# Patient Record
Sex: Female | Born: 1955 | Race: Black or African American | Hispanic: No | Marital: Married
Health system: Southern US, Community
[De-identification: ages and names within clinical notes are randomized; demographics above are authoritative.]

---

## 1998-04-22 ENCOUNTER — Encounter: Admission: RE | Admit: 1998-04-22 | Discharge: 1998-04-22 | Payer: Self-pay | Admitting: *Deleted

## 2000-08-31 ENCOUNTER — Encounter: Payer: Self-pay | Admitting: Otolaryngology

## 2000-08-31 ENCOUNTER — Encounter: Admission: RE | Admit: 2000-08-31 | Discharge: 2000-08-31 | Payer: Self-pay | Admitting: Otolaryngology

## 2001-11-12 ENCOUNTER — Emergency Department (HOSPITAL_COMMUNITY): Admission: EM | Admit: 2001-11-12 | Discharge: 2001-11-12 | Payer: Self-pay | Admitting: Emergency Medicine

## 2007-02-12 ENCOUNTER — Encounter
Admission: RE | Admit: 2007-02-12 | Discharge: 2007-02-12 | Payer: Self-pay | Admitting: Physical Medicine and Rehabilitation

## 2013-05-07 ENCOUNTER — Other Ambulatory Visit: Payer: Self-pay | Admitting: Nurse Practitioner

## 2013-05-07 DIAGNOSIS — Z1231 Encounter for screening mammogram for malignant neoplasm of breast: Secondary | ICD-10-CM

## 2013-05-31 ENCOUNTER — Ambulatory Visit: Payer: Self-pay

## 2013-06-14 ENCOUNTER — Ambulatory Visit: Payer: Self-pay

## 2013-07-01 ENCOUNTER — Ambulatory Visit
Admission: RE | Admit: 2013-07-01 | Discharge: 2013-07-01 | Disposition: A | Discharge status: Home / Self Care | Payer: PRIVATE HEALTH INSURANCE | Source: Ambulatory Visit | Attending: Nurse Practitioner | Admitting: Nurse Practitioner

## 2013-07-01 DIAGNOSIS — Z1231 Encounter for screening mammogram for malignant neoplasm of breast: Secondary | ICD-10-CM

## 2013-07-03 ENCOUNTER — Other Ambulatory Visit: Payer: Self-pay | Admitting: Nurse Practitioner

## 2013-07-03 DIAGNOSIS — R928 Other abnormal and inconclusive findings on diagnostic imaging of breast: Secondary | ICD-10-CM

## 2013-07-26 ENCOUNTER — Other Ambulatory Visit: Payer: PRIVATE HEALTH INSURANCE

## 2013-08-09 ENCOUNTER — Other Ambulatory Visit: Payer: PRIVATE HEALTH INSURANCE

## 2013-08-21 ENCOUNTER — Ambulatory Visit
Admission: RE | Admit: 2013-08-21 | Discharge: 2013-08-21 | Disposition: A | Discharge status: Home / Self Care | Payer: PRIVATE HEALTH INSURANCE | Source: Ambulatory Visit | Attending: Nurse Practitioner | Admitting: Nurse Practitioner

## 2013-08-21 DIAGNOSIS — R928 Other abnormal and inconclusive findings on diagnostic imaging of breast: Secondary | ICD-10-CM

## 2014-01-24 ENCOUNTER — Other Ambulatory Visit: Payer: Self-pay | Admitting: Nurse Practitioner

## 2014-01-24 DIAGNOSIS — R921 Mammographic calcification found on diagnostic imaging of breast: Secondary | ICD-10-CM

## 2014-02-26 ENCOUNTER — Ambulatory Visit
Admission: RE | Admit: 2014-02-26 | Discharge: 2014-02-26 | Disposition: A | Discharge status: Home / Self Care | Payer: Self-pay | Source: Ambulatory Visit | Attending: Nurse Practitioner | Admitting: Nurse Practitioner

## 2014-02-26 DIAGNOSIS — R921 Mammographic calcification found on diagnostic imaging of breast: Secondary | ICD-10-CM

## 2014-07-17 ENCOUNTER — Other Ambulatory Visit: Payer: Self-pay | Admitting: Nurse Practitioner

## 2014-07-17 DIAGNOSIS — R921 Mammographic calcification found on diagnostic imaging of breast: Secondary | ICD-10-CM

## 2014-07-28 ENCOUNTER — Inpatient Hospital Stay: Admission: RE | Admit: 2014-07-28 | Payer: PRIVATE HEALTH INSURANCE | Source: Ambulatory Visit

## 2014-09-01 ENCOUNTER — Ambulatory Visit
Admission: RE | Admit: 2014-09-01 | Discharge: 2014-09-01 | Disposition: A | Discharge status: Home / Self Care | Payer: 59 | Source: Ambulatory Visit | Attending: Nurse Practitioner | Admitting: Nurse Practitioner

## 2014-09-01 DIAGNOSIS — R921 Mammographic calcification found on diagnostic imaging of breast: Secondary | ICD-10-CM

## 2015-08-31 ENCOUNTER — Other Ambulatory Visit: Payer: Self-pay

## 2015-08-31 DIAGNOSIS — Z1231 Encounter for screening mammogram for malignant neoplasm of breast: Secondary | ICD-10-CM

## 2015-09-16 ENCOUNTER — Ambulatory Visit: Payer: Self-pay

## 2015-10-09 ENCOUNTER — Ambulatory Visit: Payer: Self-pay

## 2015-10-29 ENCOUNTER — Ambulatory Visit
Admission: RE | Admit: 2015-10-29 | Discharge: 2015-10-29 | Disposition: A | Discharge status: Home / Self Care | Payer: 59 | Source: Ambulatory Visit

## 2015-10-29 DIAGNOSIS — Z1231 Encounter for screening mammogram for malignant neoplasm of breast: Secondary | ICD-10-CM

## 2016-05-09 ENCOUNTER — Ambulatory Visit: Payer: Self-pay | Admitting: Gynecology

## 2016-06-13 ENCOUNTER — Ambulatory Visit: Payer: Self-pay | Admitting: Gynecology

## 2016-06-27 ENCOUNTER — Ambulatory Visit: Payer: Self-pay | Admitting: Gynecology

## 2016-07-25 ENCOUNTER — Ambulatory Visit: Payer: Self-pay | Admitting: Gynecology

## 2016-07-25 DIAGNOSIS — Z0289 Encounter for other administrative examinations: Secondary | ICD-10-CM

## 2016-10-07 ENCOUNTER — Other Ambulatory Visit: Payer: Self-pay | Admitting: Family Medicine

## 2016-10-07 DIAGNOSIS — Z1231 Encounter for screening mammogram for malignant neoplasm of breast: Secondary | ICD-10-CM

## 2016-11-04 ENCOUNTER — Ambulatory Visit: Payer: Self-pay

## 2016-11-28 ENCOUNTER — Ambulatory Visit: Payer: Self-pay

## 2017-01-13 ENCOUNTER — Ambulatory Visit: Payer: Self-pay

## 2017-02-20 ENCOUNTER — Ambulatory Visit: Payer: Self-pay

## 2017-03-03 ENCOUNTER — Ambulatory Visit: Payer: Self-pay

## 2017-03-21 ENCOUNTER — Ambulatory Visit
Admission: RE | Admit: 2017-03-21 | Discharge: 2017-03-21 | Disposition: A | Discharge status: Home / Self Care | Payer: BLUE CROSS/BLUE SHIELD | Source: Ambulatory Visit | Attending: Family Medicine | Admitting: Family Medicine

## 2017-03-21 DIAGNOSIS — Z1231 Encounter for screening mammogram for malignant neoplasm of breast: Secondary | ICD-10-CM

## 2017-03-22 ENCOUNTER — Encounter: Payer: Self-pay | Admitting: Gynecology

## 2018-04-25 ENCOUNTER — Other Ambulatory Visit: Payer: Self-pay | Admitting: Family Medicine

## 2018-04-25 DIAGNOSIS — Z1231 Encounter for screening mammogram for malignant neoplasm of breast: Secondary | ICD-10-CM

## 2018-06-01 ENCOUNTER — Ambulatory Visit: Payer: BLUE CROSS/BLUE SHIELD

## 2018-07-04 ENCOUNTER — Ambulatory Visit
Admission: RE | Admit: 2018-07-04 | Discharge: 2018-07-04 | Disposition: A | Discharge status: Home / Self Care | Payer: BLUE CROSS/BLUE SHIELD | Source: Ambulatory Visit | Attending: Family Medicine | Admitting: Family Medicine

## 2018-07-04 DIAGNOSIS — Z1231 Encounter for screening mammogram for malignant neoplasm of breast: Secondary | ICD-10-CM

## 2019-07-16 ENCOUNTER — Other Ambulatory Visit: Payer: Self-pay | Admitting: Family Medicine

## 2019-07-16 DIAGNOSIS — Z1231 Encounter for screening mammogram for malignant neoplasm of breast: Secondary | ICD-10-CM

## 2019-08-28 ENCOUNTER — Ambulatory Visit: Payer: BLUE CROSS/BLUE SHIELD

## 2019-10-23 ENCOUNTER — Ambulatory Visit
Admission: RE | Admit: 2019-10-23 | Discharge: 2019-10-23 | Disposition: A | Discharge status: Home / Self Care | Payer: BC Managed Care – PPO | Source: Ambulatory Visit | Attending: Family Medicine | Admitting: Family Medicine

## 2019-10-23 ENCOUNTER — Other Ambulatory Visit: Payer: Self-pay

## 2019-10-23 DIAGNOSIS — Z1231 Encounter for screening mammogram for malignant neoplasm of breast: Secondary | ICD-10-CM

## 2020-08-21 ENCOUNTER — Other Ambulatory Visit: Payer: Self-pay | Admitting: Family Medicine

## 2020-08-21 DIAGNOSIS — Z1231 Encounter for screening mammogram for malignant neoplasm of breast: Secondary | ICD-10-CM

## 2020-11-02 ENCOUNTER — Other Ambulatory Visit: Payer: Self-pay

## 2020-11-02 ENCOUNTER — Ambulatory Visit
Admission: RE | Admit: 2020-11-02 | Discharge: 2020-11-02 | Disposition: A | Discharge status: Home / Self Care | Payer: 59 | Source: Ambulatory Visit | Attending: Family Medicine | Admitting: Family Medicine

## 2020-11-02 DIAGNOSIS — Z1231 Encounter for screening mammogram for malignant neoplasm of breast: Secondary | ICD-10-CM

## 2020-12-14 ENCOUNTER — Ambulatory Visit: Payer: 59

## 2021-02-01 ENCOUNTER — Ambulatory Visit: Payer: 59

## 2021-02-09 ENCOUNTER — Other Ambulatory Visit: Payer: Self-pay

## 2021-02-09 ENCOUNTER — Ambulatory Visit
Admission: RE | Admit: 2021-02-09 | Discharge: 2021-02-09 | Disposition: A | Discharge status: Home / Self Care | Payer: 59 | Source: Ambulatory Visit | Attending: Family Medicine | Admitting: Family Medicine

## 2022-06-30 LAB — EXTERNAL GENERIC LAB PROCEDURE

## 2022-08-22 ENCOUNTER — Other Ambulatory Visit: Payer: Self-pay | Admitting: Family Medicine

## 2022-08-22 DIAGNOSIS — Z1231 Encounter for screening mammogram for malignant neoplasm of breast: Secondary | ICD-10-CM

## 2022-10-06 ENCOUNTER — Ambulatory Visit: Payer: Self-pay

## 2022-10-28 LAB — EXTERNAL GENERIC LAB PROCEDURE: COLOGUARD: NEGATIVE

## 2023-03-10 IMAGING — MG MM DIGITAL SCREENING BILAT W/ TOMO AND CAD
8 series · 9 of 24 positions shown · non-contrast
Comparison: Previous exam(s).

CLINICAL DATA: Screening.

EXAM:
DIGITAL SCREENING BILATERAL MAMMOGRAM WITH TOMOSYNTHESIS AND CAD
TECHNIQUE: Bilateral screening digital craniocaudal and mediolateral oblique
mammograms were obtained. Bilateral screening digital breast
tomosynthesis was performed. The images were evaluated with
computer-aided detection.

[R MLO synth-2D]
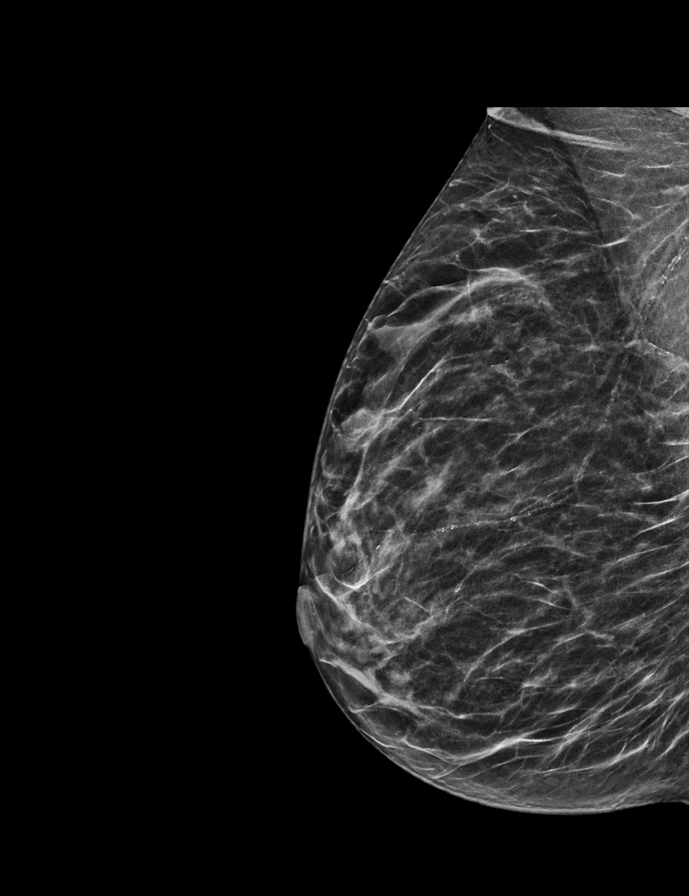

[R CC synth-2D]
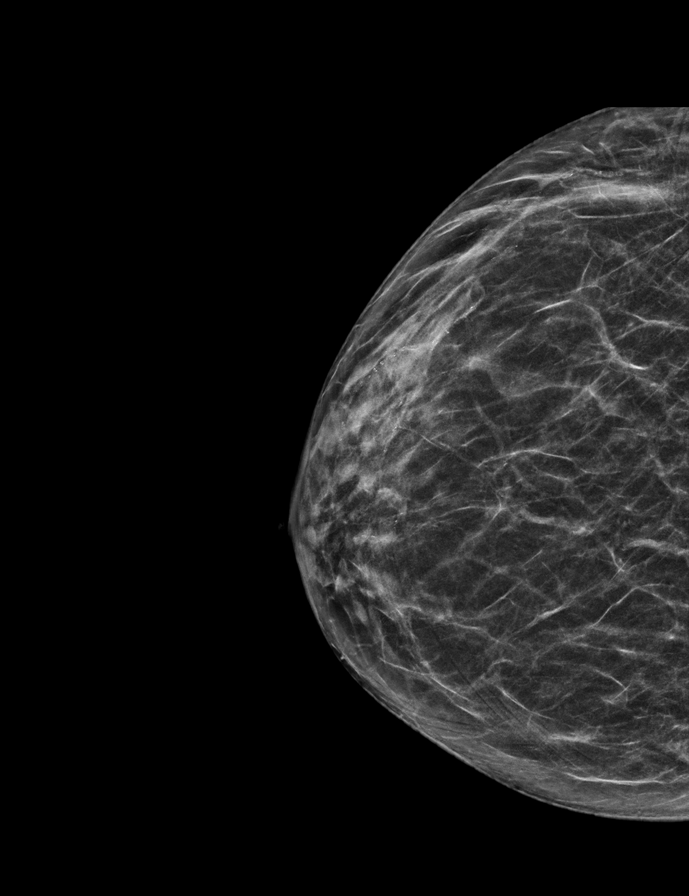

[L CC synth-2D]
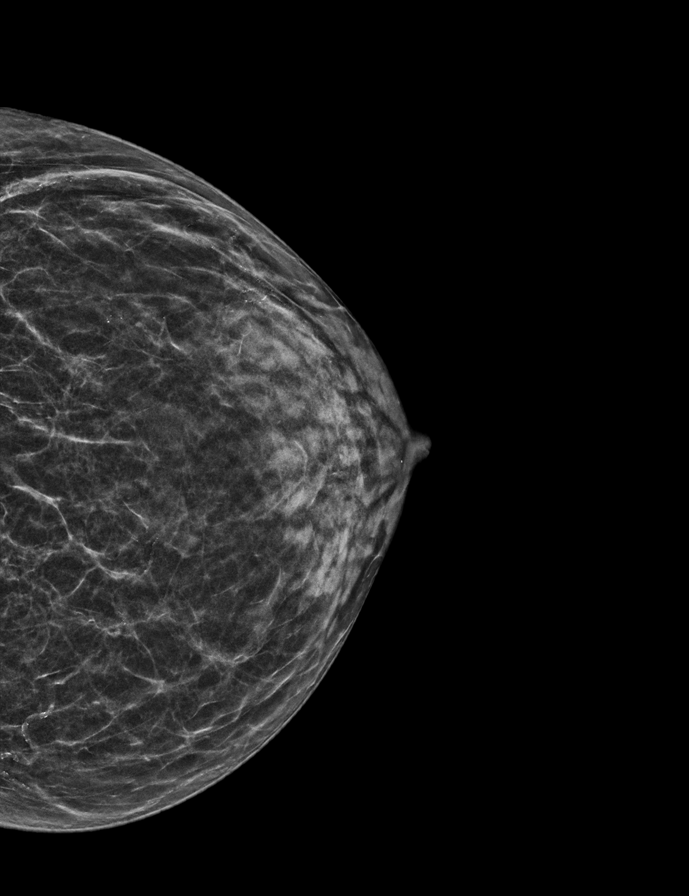

[L MLO synth-2D]
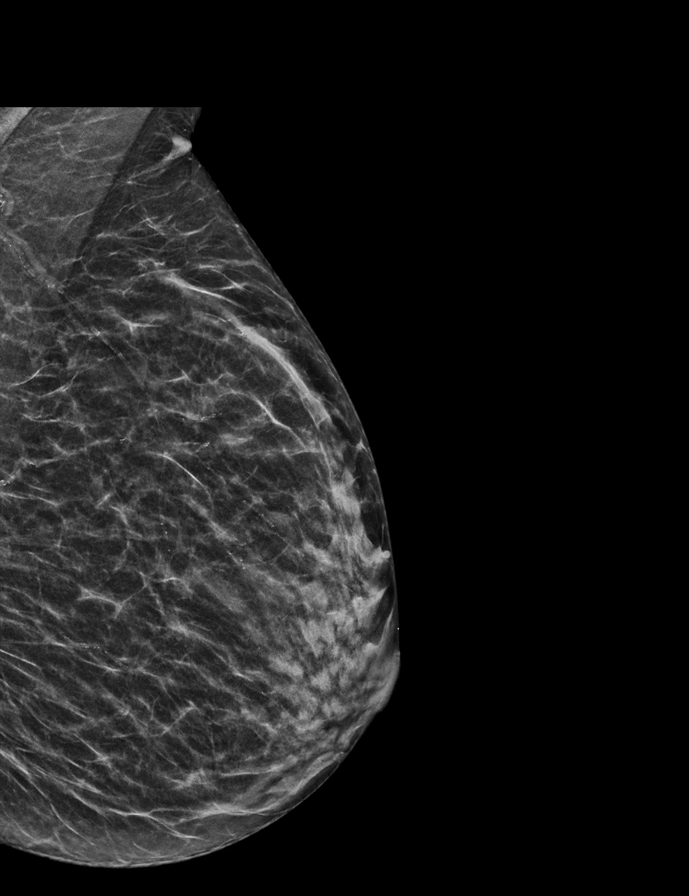

[L MLO tomo · 2 of 43 frames shown]
[frame 14/43]
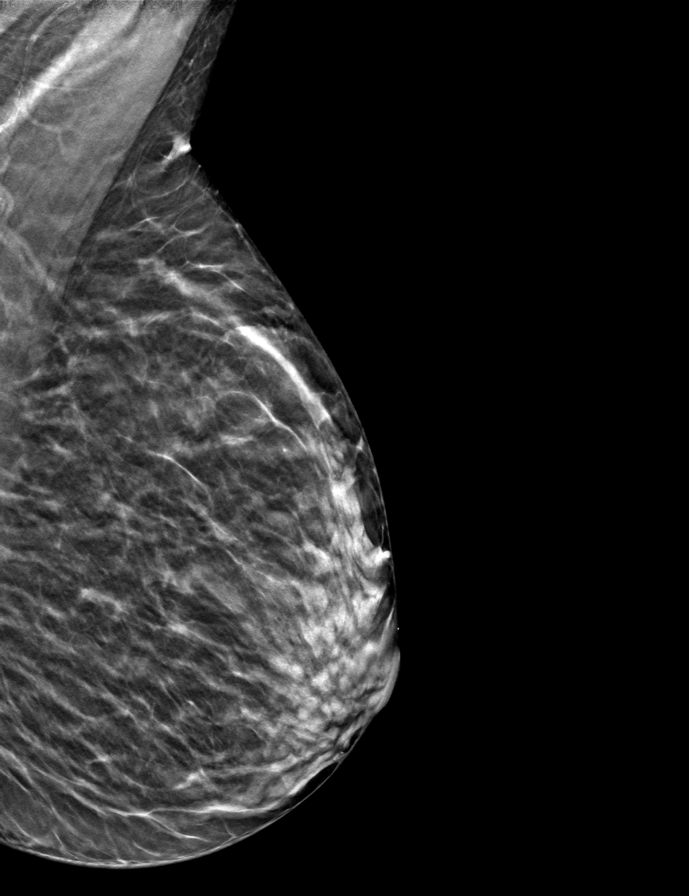
[frame 22/43]
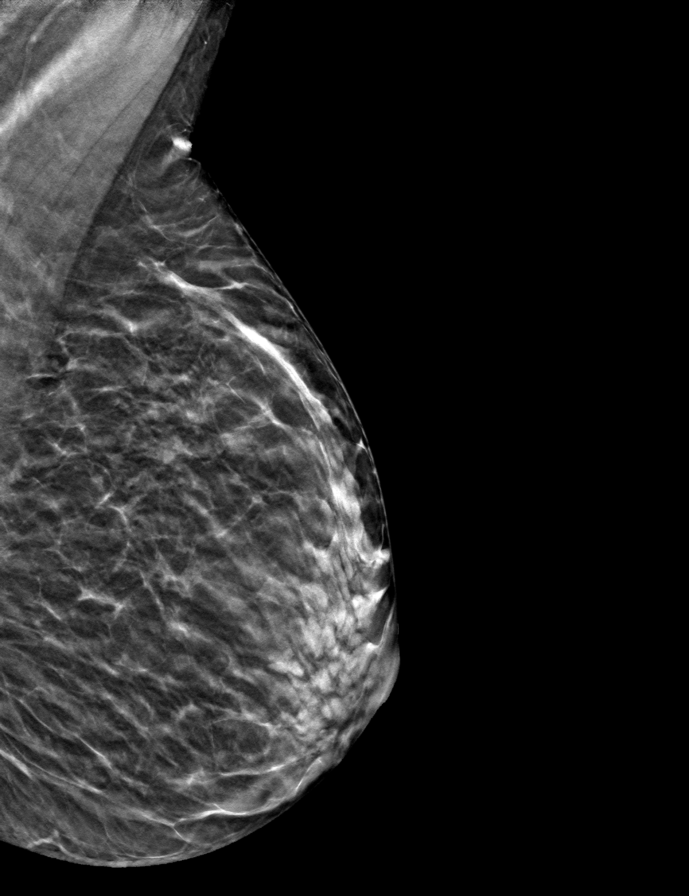

[R MLO tomo · tomo slice 25/49.0]
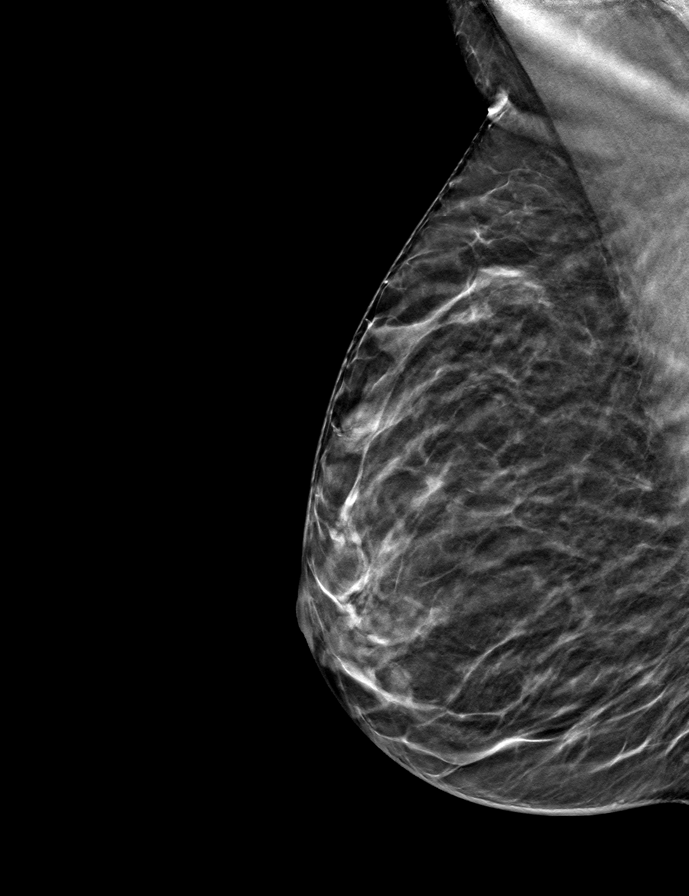

[L CC tomo · tomo slice 22/43.0]
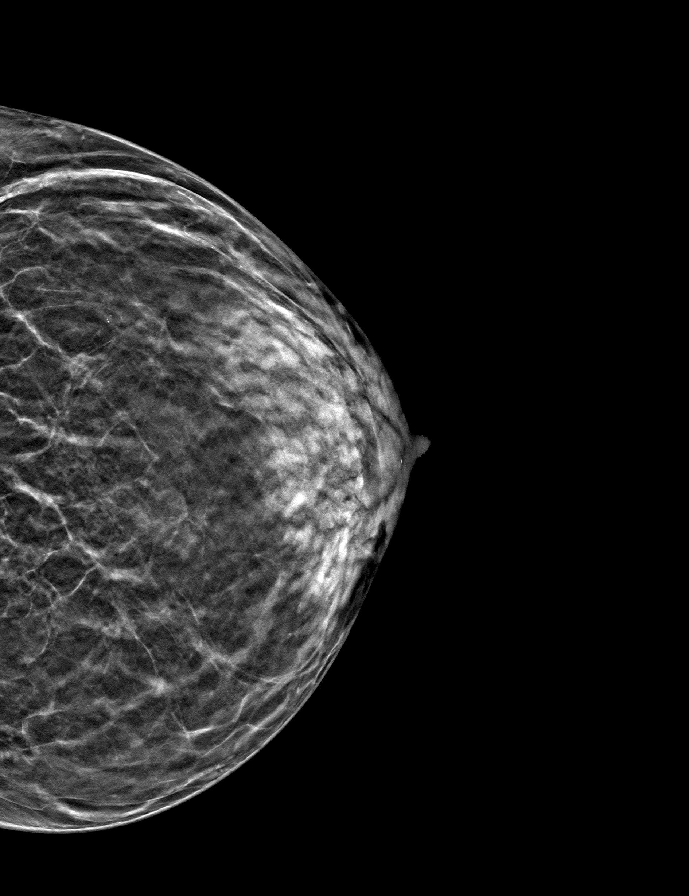

[R CC tomo · tomo slice 26/51.0]
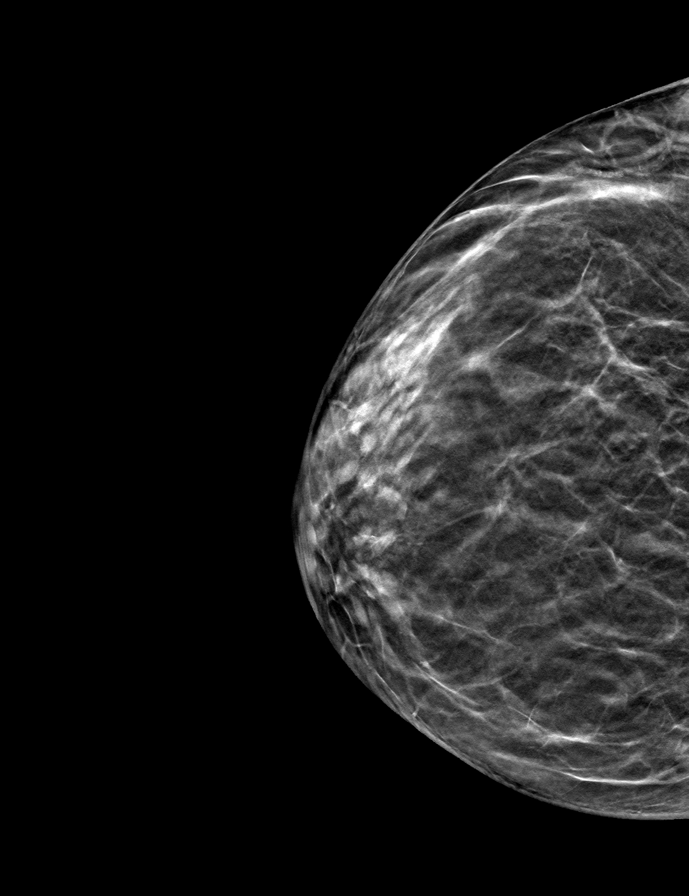

[9 of 24 positions shown; findings below may reference images not displayed]

ACR Breast Density Category c: The breast tissue is heterogeneously
dense, which may obscure small masses.
FINDINGS: There are no findings suspicious for malignancy. The images were
evaluated with computer-aided detection.
IMPRESSION: No mammographic evidence of malignancy. A result letter of this
screening mammogram will be mailed directly to the patient.

RECOMMENDATION:
Screening mammogram in one year. (Code:T4-5-GWO)

BI-RADS CATEGORY  1: Negative.

## 2023-08-16 ENCOUNTER — Ambulatory Visit: Payer: Medicare HMO | Admitting: Podiatry

## 2023-08-23 ENCOUNTER — Ambulatory Visit: Payer: Medicare HMO | Admitting: Podiatry

## 2023-09-06 ENCOUNTER — Ambulatory Visit: Payer: Medicare HMO | Admitting: Podiatry

## 2023-09-06 DIAGNOSIS — B351 Tinea unguium: Secondary | ICD-10-CM

## 2023-09-06 NOTE — Progress Notes (Signed)
  Subjective:  Patient ID: Wendy Foster, female    DOB: 07-28-1956,  MRN: [id number]  Chief Complaint  Patient presents with   Nail Problem    Patient had feet done and thinks she has fungus on bilateral big toe.    67 y.o. female presents with the above complaint.  Patient presents with thickened and onychodystrophy mycotic toenails x 10 mild pain on palpation.  She would like to discuss treatment options for nail fungus.  She does not want oral medication or topical medication.  She would like to discuss laser therapy.   Review of Systems: Negative except as noted in the HPI. Denies N/V/F/Ch.  No past medical history on file. No current outpatient medications on file.  Social History   Tobacco Use  Smoking Status Not on file  Smokeless Tobacco Not on file    Not on File Objective:  There were no vitals filed for this visit. There is no height or weight on file to calculate BMI. Constitutional Well developed. Well nourished.  Vascular Dorsalis pedis pulses palpable bilaterally. Posterior tibial pulses palpable bilaterally. Capillary refill normal to all digits.  No cyanosis or clubbing noted. Pedal hair growth normal.  Neurologic Normal speech. Oriented to person, place, and time. Epicritic sensation to light touch grossly present bilaterally.  Dermatologic Nails thickened onychodystrophy mycotic toenails x 7 Skin within normal limits  Orthopedic: Normal joint ROM without pain or crepitus bilaterally. No visible deformities. No bony tenderness.   Radiographs: None Assessment:   1. Nail fungus   2. Onychomycosis due to dermatophyte    Plan:  Patient was evaluated and treated and all questions answered.  Toenails x 10 onychomycosis/nail fungus -Educated the patient on the etiology of onychomycosis and various treatment options associated with improving the fungal load.  I explained to the patient that there is 3 treatment options available to treat the  onychomycosis including topical, p.o., laser treatment.  Patient elected to undergo topical medication with Penlac as well as scheduled for laser the first of the year.  She states understanding.   No follow-ups on file.

## 2023-11-10 ENCOUNTER — Other Ambulatory Visit: Payer: Medicare HMO

## 2023-11-17 ENCOUNTER — Ambulatory Visit: Payer: Medicare HMO | Admitting: Podiatrist

## 2023-11-17 DIAGNOSIS — B351 Tinea unguium: Secondary | ICD-10-CM

## 2023-11-17 NOTE — Patient Instructions (Signed)

## 2023-11-17 NOTE — Progress Notes (Deleted)
 Patient presents today for the laser treatment # ***. Diagnosed with mycotic nail infection by Dr. Reesa Chew most affected are ***  All other systems are negative.  Patients nails were filed thin using a sterile burr.    Laser therapy via [facility] therapy system was admininstered to affected toenails *** .  The Patient tolerated the treatment well. All safety precautions were in place.   Single laser pass was done on non-affected nails.   Follow up in 4 weeks for laser # ***

## 2023-11-17 NOTE — Progress Notes (Signed)
 Patient presents today for the laser treatment # 1 (11/17/23) . Diagnosed with mycotic nail infection by Dr. Tobie Done most affected are bilateral first  All other systems are negative.  Patients nails were filed thin using a sterile burr.    Laser therapy via [facility] therapy system was admininstered to affected toenails x 2 .  The Patient tolerated the treatment well. All safety precautions were in place.      Follow up in 4-6 weeks for laser # 2  See below for pre laser treatment photo

## 2023-11-29 ENCOUNTER — Other Ambulatory Visit: Payer: Self-pay | Admitting: Family Medicine

## 2023-11-29 DIAGNOSIS — Z1231 Encounter for screening mammogram for malignant neoplasm of breast: Secondary | ICD-10-CM

## 2023-12-12 ENCOUNTER — Ambulatory Visit: Payer: Medicare HMO

## 2023-12-21 ENCOUNTER — Ambulatory Visit
Admission: RE | Admit: 2023-12-21 | Discharge: 2023-12-21 | Disposition: A | Discharge status: Home / Self Care | Payer: Medicare HMO | Source: Ambulatory Visit | Attending: Family Medicine | Admitting: Family Medicine

## 2023-12-21 DIAGNOSIS — Z1231 Encounter for screening mammogram for malignant neoplasm of breast: Secondary | ICD-10-CM

## 2024-01-19 ENCOUNTER — Other Ambulatory Visit: Payer: Medicare HMO

## 2024-02-16 ENCOUNTER — Ambulatory Visit (INDEPENDENT_AMBULATORY_CARE_PROVIDER_SITE_OTHER): Admitting: *Deleted

## 2024-02-16 DIAGNOSIS — B351 Tinea unguium: Secondary | ICD-10-CM

## 2024-02-16 NOTE — Progress Notes (Signed)
 Patient presents today for the 2nd laser treatment. Diagnosed with mycotic nail infection by Dr. Allena Katz.   Toenail most affected hallux bilateral. She feels she may only need one more treatment  All other systems are negative.  Nails were filed thin. Laser therapy was administered to 1st toenails bilateral and patient tolerated the treatment well. All safety precautions were in place.    Follow up in 6 weeks for laser # 3.

## 2024-04-19 ENCOUNTER — Ambulatory Visit (INDEPENDENT_AMBULATORY_CARE_PROVIDER_SITE_OTHER): Admitting: *Deleted

## 2024-04-19 DIAGNOSIS — B351 Tinea unguium: Secondary | ICD-10-CM

## 2024-04-19 NOTE — Progress Notes (Signed)
 Patient presents today for the 3rd laser treatment. Diagnosed with mycotic nail infection by Dr. Lydia Sams.   Toenail most affected hallux bilateral. Her nails are almost completely clear.  All other systems are negative.  Nails were filed thin. Laser therapy was administered to 1st toenails bilateral and patient tolerated the treatment well. All safety precautions were in place.    Patient would like to let nails grow on out and let us  know if she needs more treatments.
